# Patient Record
Sex: Male | Born: 1990 | Race: Black or African American | Hispanic: No | Marital: Single | Smoking: Never smoker
Health system: Southern US, Community
[De-identification: ages and names within clinical notes are randomized; demographics above are authoritative.]

---

## 2020-03-10 ENCOUNTER — Encounter (HOSPITAL_COMMUNITY): Payer: Self-pay | Admitting: *Deleted

## 2020-03-10 ENCOUNTER — Other Ambulatory Visit: Payer: Self-pay

## 2020-03-10 ENCOUNTER — Emergency Department (HOSPITAL_COMMUNITY)
Admission: EM | Admit: 2020-03-10 | Discharge: 2020-03-11 | Disposition: A | Discharge status: Home / Self Care | Payer: Self-pay | Attending: Emergency Medicine | Admitting: Emergency Medicine

## 2020-03-10 DIAGNOSIS — Z046 Encounter for general psychiatric examination, requested by authority: Secondary | ICD-10-CM | POA: Insufficient documentation

## 2020-03-10 DIAGNOSIS — F4323 Adjustment disorder with mixed anxiety and depressed mood: Secondary | ICD-10-CM | POA: Diagnosis present

## 2020-03-10 DIAGNOSIS — F4321 Adjustment disorder with depressed mood: Secondary | ICD-10-CM | POA: Insufficient documentation

## 2020-03-10 DIAGNOSIS — Z20822 Contact with and (suspected) exposure to covid-19: Secondary | ICD-10-CM | POA: Insufficient documentation

## 2020-03-10 LAB — ACETAMINOPHEN LEVEL: Acetaminophen (Tylenol), Serum: <10 ug/mL — ABNORMAL LOW (ref 10–30)

## 2020-03-10 LAB — CBC
HCT: 41.6 % (ref 39.0–52.0)
Hemoglobin: 14.3 g/dL (ref 13.0–17.0)
MCH: 28.4 pg (ref 26.0–34.0)
MCHC: 34.4 g/dL (ref 30.0–36.0)
MCV: 82.5 fL (ref 80.0–100.0)
Platelets: 321 10*3/uL (ref 150–400)
RBC: 5.04 MIL/uL (ref 4.22–5.81)
RDW: 12.9 % (ref 11.5–15.5)
WBC: 16.5 10*3/uL — ABNORMAL HIGH (ref 4.0–10.5)
nRBC: 0 % (ref 0.0–0.2)

## 2020-03-10 LAB — COMPREHENSIVE METABOLIC PANEL
ALT: 21 U/L (ref 0–44)
AST: 27 U/L (ref 15–41)
Albumin: 4.8 g/dL (ref 3.5–5.0)
Alkaline Phosphatase: 61 U/L (ref 38–126)
Anion gap: 11 (ref 5–15)
BUN: 15 mg/dL (ref 6–20)
CO2: 26 mmol/L (ref 22–32)
Calcium: 9.4 mg/dL (ref 8.9–10.3)
Chloride: 101 mmol/L (ref 98–111)
Creatinine, Ser: 0.86 mg/dL (ref 0.61–1.24)
GFR calc Af Amer: >60 mL/min (ref >60)
GFR calc non Af Amer: >60 mL/min (ref >60)
Glucose, Bld: 96 mg/dL (ref 70–99)
Potassium: 3.4 mmol/L — ABNORMAL LOW (ref 3.5–5.1)
Sodium: 138 mmol/L (ref 135–145)
Total Bilirubin: 0.7 mg/dL (ref 0.3–1.2)
Total Protein: 8.6 g/dL — ABNORMAL HIGH (ref 6.5–8.1)

## 2020-03-10 LAB — SALICYLATE LEVEL: Salicylate Lvl: <7 mg/dL — ABNORMAL LOW (ref 7.0–30.0)

## 2020-03-10 LAB — ETHANOL: Alcohol, Ethyl (B): <10 mg/dL (ref <10)

## 2020-03-10 NOTE — Progress Notes (Addendum)
TOC CM spoke to pt at bedside to gain information on emergency contacts and who he desires TOC CSW/CM to share information. States his mother and girlfriend can be placed as his emergency contacts but he does not want any medical information shared with them at this time. Explained his girlfriend did call but was explained due to HIPAA not information could be provided. States "I spoke to her and I told her I was not coming back". States he was going to stay with a friend/homeboy.   Isidoro Donning RN CCM, [facility] TOC CM [phone]

## 2020-03-10 NOTE — Progress Notes (Addendum)
@  2:45pm   TOC CSW spoke withPts gf/Brianna called to verify information that [employer] had given her about pt being at [facility].  CSW disclosed to pts gf/Brianna that due to HIPPA, CSW is not to violate pts right with denial or confirmation that pt was at [facility] ED.  CSW actively listened to Brianna's concern for her and her son's safety.  Colin Mulders wants to be contacted in regards to pts dc.   @3 :10pm  TOC CSW/CM spoke with pt and verified that pts emergency contacts.  Pt stated that mom/ Chauncey Sciulli, [phone] and gf/Brianna [phone], [phone] is emergency contacts.  Pt only wants emergency contacts contacted in the case of an emergency.    Hillery Bhalla Tarpley-Carter, MSW, LCSW-A Pronouns:  She, Her, Hers                  [phone] Long ED Transitions of CareClinical Social Worker [email or web address] [phone]
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
## 2020-03-10 NOTE — ED Notes (Signed)
Patient is refusing to dress out in clothes

## 2020-03-11 ENCOUNTER — Encounter (HOSPITAL_COMMUNITY): Payer: Self-pay | Admitting: Registered Nurse

## 2020-03-11 DIAGNOSIS — F4323 Adjustment disorder with mixed anxiety and depressed mood: Secondary | ICD-10-CM

## 2020-03-11 DIAGNOSIS — F4321 Adjustment disorder with depressed mood: Secondary | ICD-10-CM

## 2020-03-11 DIAGNOSIS — Z046 Encounter for general psychiatric examination, requested by authority: Secondary | ICD-10-CM

## 2020-03-11 LAB — SARS CORONAVIRUS 2 BY RT PCR (HOSPITAL ORDER, PERFORMED IN ~~LOC~~ HOSPITAL LAB): SARS Coronavirus 2: NEGATIVE

## 2020-03-11 NOTE — Discharge Instructions (Signed)
For your behavioral health needs, you are advised to follow up with [facility]:       [facility]      [address]      [place], [place] [address]      [phone]      New patients are being seen in their walk-in clinic.  Walk-in hours are Monday - Thursday from 8:00 am - 11:00 am for psychiatry, and Friday from 1:00 pm - 4:00 pm for therapy.  Walk-in patients are seen on a first come, first served basis, so try to arrive as early as possible for the best chance of being seen the same day.
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
## 2020-03-11 NOTE — ED Provider Notes (Signed)
Emergency Medicine Observation Re-evaluation Note  Matthew Tanner is a 29 y.o. male, seen on rounds today.  Pt initially presented to the ED for complaints of Medical Clearance and IVC Currently, the patient is sleeping.  Physical Exam  BP (!) 146/103 (BP Location: Right Arm)   Pulse 72   Temp 97.8 F (36.6 C) (Oral)   Resp 16   Ht 5\' 8"  (1.727 m)   Wt 117.9 kg   SpO2 100%   BMI 39.53 kg/m  Physical Exam CONSTITUTIONAL: Well-appearing, NAD NEURO: Sleeping with no focal deficits ENT/NECK:  supple, no JVD CARDIO: Regular rate, well-perfused PULM: No increased work of breathing GI/GU: Nondistended MSK/SPINE:  No gross deformities, no edema SKIN:  no rash, atraumatic PSYCH: Deferred  ED Course / MDM  EKG:    I have reviewed the labs performed to date as well as medications administered while in observation.  Recent changes in the last 24 hours include none.  Plan  Current plan is for reevaluation of need for IVC in the morning.  IVC currently set to expire this afternoon. Patient is under full IVC at this time.   , MD 03/11/20 [phone]

## 2021-03-15 ENCOUNTER — Emergency Department (HOSPITAL_COMMUNITY)
Admission: EM | Admit: 2021-03-15 | Discharge: 2021-03-15 | Disposition: A | Discharge status: Home / Self Care | Payer: Self-pay | Attending: Emergency Medicine | Admitting: Emergency Medicine

## 2021-03-15 ENCOUNTER — Emergency Department (HOSPITAL_COMMUNITY): Payer: Self-pay

## 2021-03-15 ENCOUNTER — Other Ambulatory Visit: Payer: Self-pay

## 2021-03-15 DIAGNOSIS — T1490XA Injury, unspecified, initial encounter: Secondary | ICD-10-CM

## 2021-03-15 DIAGNOSIS — R52 Pain, unspecified: Secondary | ICD-10-CM

## 2021-03-15 DIAGNOSIS — Z20822 Contact with and (suspected) exposure to covid-19: Secondary | ICD-10-CM | POA: Insufficient documentation

## 2021-03-15 DIAGNOSIS — M79601 Pain in right arm: Secondary | ICD-10-CM | POA: Insufficient documentation

## 2021-03-15 DIAGNOSIS — M25511 Pain in right shoulder: Secondary | ICD-10-CM | POA: Insufficient documentation

## 2021-03-15 DIAGNOSIS — Y900 Blood alcohol level of less than 20 mg/100 ml: Secondary | ICD-10-CM | POA: Insufficient documentation

## 2021-03-15 DIAGNOSIS — S4351XA Sprain of right acromioclavicular joint, initial encounter: Secondary | ICD-10-CM

## 2021-03-15 DIAGNOSIS — Y92411 Interstate highway as the place of occurrence of the external cause: Secondary | ICD-10-CM | POA: Insufficient documentation

## 2021-03-15 DIAGNOSIS — S43401A Unspecified sprain of right shoulder joint, initial encounter: Secondary | ICD-10-CM | POA: Insufficient documentation

## 2021-03-15 LAB — I-STAT CHEM 8, ED
BUN: 13 mg/dL (ref 6–20)
Calcium, Ion: 1.19 mmol/L (ref 1.15–1.40)
Chloride: 101 mmol/L (ref 98–111)
Creatinine, Ser: 0.9 mg/dL (ref 0.61–1.24)
Glucose, Bld: 120 mg/dL — ABNORMAL HIGH (ref 70–99)
HCT: 40 % (ref 39.0–52.0)
Hemoglobin: 13.6 g/dL (ref 13.0–17.0)
Potassium: 3.6 mmol/L (ref 3.5–5.1)
Sodium: 139 mmol/L (ref 135–145)
TCO2: 26 mmol/L (ref 22–32)

## 2021-03-15 LAB — SAMPLE TO BLOOD BANK

## 2021-03-15 LAB — RESP PANEL BY RT-PCR (FLU A&B, COVID) ARPGX2
Influenza A by PCR: NEGATIVE
Influenza B by PCR: NEGATIVE
SARS Coronavirus 2 by RT PCR: NEGATIVE

## 2021-03-15 LAB — COMPREHENSIVE METABOLIC PANEL
ALT: 20 U/L (ref 0–44)
AST: 19 U/L (ref 15–41)
Albumin: 4.1 g/dL (ref 3.5–5.0)
Alkaline Phosphatase: 57 U/L (ref 38–126)
Anion gap: 10 (ref 5–15)
BUN: 12 mg/dL (ref 6–20)
CO2: 25 mmol/L (ref 22–32)
Calcium: 9.6 mg/dL (ref 8.9–10.3)
Chloride: 101 mmol/L (ref 98–111)
Creatinine, Ser: 0.92 mg/dL (ref 0.61–1.24)
GFR, Estimated: >60 mL/min (ref >60)
Glucose, Bld: 125 mg/dL — ABNORMAL HIGH (ref 70–99)
Potassium: 3.5 mmol/L (ref 3.5–5.1)
Sodium: 136 mmol/L (ref 135–145)
Total Bilirubin: 0.7 mg/dL (ref 0.3–1.2)
Total Protein: 7.8 g/dL (ref 6.5–8.1)

## 2021-03-15 LAB — CBC
HCT: 40.8 % (ref 39.0–52.0)
Hemoglobin: 14 g/dL (ref 13.0–17.0)
MCH: 28.6 pg (ref 26.0–34.0)
MCHC: 34.3 g/dL (ref 30.0–36.0)
MCV: 83.4 fL (ref 80.0–100.0)
Platelets: 303 10*3/uL (ref 150–400)
RBC: 4.89 MIL/uL (ref 4.22–5.81)
RDW: 12.5 % (ref 11.5–15.5)
WBC: 20.3 10*3/uL — ABNORMAL HIGH (ref 4.0–10.5)
nRBC: 0 % (ref 0.0–0.2)

## 2021-03-15 LAB — PROTIME-INR
INR: 1.1 (ref 0.8–1.2)
Prothrombin Time: 13.7 seconds (ref 11.4–15.2)

## 2021-03-15 LAB — ETHANOL: Alcohol, Ethyl (B): <10 mg/dL (ref <10)

## 2021-03-15 LAB — LACTIC ACID, PLASMA: Lactic Acid, Venous: 1.4 mmol/L (ref 0.5–1.9)

## 2021-03-15 MED ORDER — ACETAMINOPHEN 325 MG PO TABS: 650.0000 mg | ORAL_TABLET | Freq: Once | ORAL | Status: DC

## 2021-03-15 MED ORDER — IBUPROFEN 800 MG PO TABS: 800.0000 mg | ORAL_TABLET | Freq: Three times a day (TID) | ORAL | 0 refills | Status: DC | PRN
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
## 2021-03-15 NOTE — Progress Notes (Signed)
   03/15/21 2103  Clinical Encounter Type  Visited With Patient  Visit Type Trauma  Referral From Nurse  Consult/Referral To Chaplain   Chaplain responded. Patient sitting up in the bed and open for spiritual care visit. There was no support person present. Offered spiritual care of pray. This note was prepared by Deneen Harts, M.Div..  For questions please contact by phone [phone].
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
## 2021-03-16 ENCOUNTER — Encounter (HOSPITAL_COMMUNITY): Payer: Self-pay | Admitting: Emergency Medicine

## 2021-04-05 ENCOUNTER — Emergency Department (HOSPITAL_COMMUNITY)
Admission: EM | Admit: 2021-04-05 | Discharge: 2021-04-05 | Disposition: A | Discharge status: Home / Self Care | Payer: Self-pay | Attending: Emergency Medicine | Admitting: Emergency Medicine

## 2021-04-05 ENCOUNTER — Other Ambulatory Visit: Payer: Self-pay

## 2021-04-05 ENCOUNTER — Encounter (HOSPITAL_COMMUNITY): Payer: Self-pay | Admitting: *Deleted

## 2021-04-05 DIAGNOSIS — R519 Headache, unspecified: Secondary | ICD-10-CM | POA: Insufficient documentation

## 2021-04-05 DIAGNOSIS — Z5321 Procedure and treatment not carried out due to patient leaving prior to being seen by health care provider: Secondary | ICD-10-CM | POA: Insufficient documentation
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
## 2021-04-05 NOTE — ED Notes (Signed)
Pt has not checked in for vitals

## 2021-04-05 NOTE — ED Notes (Signed)
Called pt x3 for vitals, no response. 

## 2021-06-01 ENCOUNTER — Emergency Department (HOSPITAL_COMMUNITY)
Admission: EM | Admit: 2021-06-01 | Discharge: 2021-06-02 | Discharge status: Against Medical Advice | Payer: Self-pay | Attending: Emergency Medicine | Admitting: Emergency Medicine

## 2021-06-01 ENCOUNTER — Encounter (HOSPITAL_COMMUNITY): Payer: Self-pay | Admitting: Emergency Medicine

## 2021-06-01 ENCOUNTER — Other Ambulatory Visit: Payer: Self-pay

## 2021-06-01 DIAGNOSIS — Z5321 Procedure and treatment not carried out due to patient leaving prior to being seen by health care provider: Secondary | ICD-10-CM | POA: Insufficient documentation

## 2021-06-01 DIAGNOSIS — K0889 Other specified disorders of teeth and supporting structures: Secondary | ICD-10-CM | POA: Insufficient documentation

## 2021-06-01 NOTE — ED Triage Notes (Signed)
Patient reports left upper dental pain with swelling onset 2 days ago .

## 2021-06-01 NOTE — ED Provider Notes (Signed)
Emergency Medicine Provider Triage Evaluation Note  Matthew Tanner , a 30 y.o. male  was evaluated in triage.  Pt complains of left upper dental pain with facial swelling.  This started 2 days ago.  He poked the area with a needle and then the swelling worsened.  He denies any fevers.  He has pain whenever he eats or drinks however has no difficulty swallowing.  No sore throat.  Review of Systems  Positive: See above Negative:   Physical Exam  BP (!) 143/87 (BP Location: Left Arm)   Pulse 77   Temp 99 F (37.2 C) (Oral)   Resp 18   SpO2 100%  Gen:   Awake, no distress   Resp:  Normal effort  MSK:   Moves extremities without difficulty  Other:  There is a large, approximately 1.5 to 2 cm interaural area of edema between the left upper molars and the gingival surface.  This area is fluctuant and tender without active drainage and appears to be a drainable abscess at this time.  Medical Decision Making  Medically screening exam initiated at 10:44 PM.  Appropriate orders placed.  Matthew Tanner was informed that the remainder of the evaluation will be completed by another provider, this initial triage assessment does not replace that evaluation, and the importance of remaining in the ED until their evaluation is complete.  Patient presents today for evaluation of left upper dental pain.  This appears to be complicated by a dental abscess that appears large enough to need drainage.  He is afebrile and not meeting sepsis criteria at this time. Patient is not suitable for discharge from triage due to suspecting he needs a dental abscess drained.  Note: Portions of this report may have been transcribed using voice recognition software. Every effort was made to ensure accuracy; however, inadvertent computerized transcription errors may be present    Cristina Gong, PA-C 06/02/21 0000    Gloris Manchester, MD 06/02/21 [phone]

## 2021-06-02 ENCOUNTER — Emergency Department (HOSPITAL_BASED_OUTPATIENT_CLINIC_OR_DEPARTMENT_OTHER)
Admission: EM | Admit: 2021-06-02 | Discharge: 2021-06-03 | Disposition: A | Discharge status: Home / Self Care | Payer: Self-pay | Attending: Emergency Medicine | Admitting: Emergency Medicine

## 2021-06-02 ENCOUNTER — Emergency Department (HOSPITAL_BASED_OUTPATIENT_CLINIC_OR_DEPARTMENT_OTHER): Payer: Self-pay

## 2021-06-02 ENCOUNTER — Encounter (HOSPITAL_BASED_OUTPATIENT_CLINIC_OR_DEPARTMENT_OTHER): Payer: Self-pay

## 2021-06-02 ENCOUNTER — Other Ambulatory Visit: Payer: Self-pay

## 2021-06-02 DIAGNOSIS — R220 Localized swelling, mass and lump, head: Secondary | ICD-10-CM | POA: Insufficient documentation

## 2021-06-02 LAB — CBC WITH DIFFERENTIAL/PLATELET
Abs Immature Granulocytes: 0.04 10*3/uL (ref 0.00–0.07)
Basophils Absolute: 0.1 10*3/uL (ref 0.0–0.1)
Basophils Relative: 0 %
Eosinophils Absolute: 0.1 10*3/uL (ref 0.0–0.5)
Eosinophils Relative: 1 %
HCT: 39.8 % (ref 39.0–52.0)
Hemoglobin: 13.6 g/dL (ref 13.0–17.0)
Immature Granulocytes: 0 %
Lymphocytes Relative: 26 %
Lymphs Abs: 3 10*3/uL (ref 0.7–4.0)
MCH: 28.1 pg (ref 26.0–34.0)
MCHC: 34.2 g/dL (ref 30.0–36.0)
MCV: 82.2 fL (ref 80.0–100.0)
Monocytes Absolute: 1 10*3/uL (ref 0.1–1.0)
Monocytes Relative: 8 %
Neutro Abs: 7.5 10*3/uL (ref 1.7–7.7)
Neutrophils Relative %: 65 %
Platelets: 359 10*3/uL (ref 150–400)
RBC: 4.84 MIL/uL (ref 4.22–5.81)
RDW: 12.9 % (ref 11.5–15.5)
WBC: 11.7 10*3/uL — ABNORMAL HIGH (ref 4.0–10.5)
nRBC: 0 % (ref 0.0–0.2)

## 2021-06-02 LAB — BASIC METABOLIC PANEL
Anion gap: 8 (ref 5–15)
BUN: 12 mg/dL (ref 6–20)
CO2: 29 mmol/L (ref 22–32)
Calcium: 9.6 mg/dL (ref 8.9–10.3)
Chloride: 104 mmol/L (ref 98–111)
Creatinine, Ser: 0.78 mg/dL (ref 0.61–1.24)
GFR, Estimated: >60 mL/min (ref >60)
Glucose, Bld: 86 mg/dL (ref 70–99)
Potassium: 3.6 mmol/L (ref 3.5–5.1)
Sodium: 141 mmol/L (ref 135–145)

## 2021-06-02 MED ORDER — AMOXICILLIN-POT CLAVULANATE 875-125 MG PO TABS
1.0000 | ORAL_TABLET | Freq: Once | ORAL | Status: AC
  Administered 2021-06-02: 1 via ORAL
  Filled 2021-06-02: qty 1

## 2021-06-02 MED ORDER — KETOROLAC TROMETHAMINE 15 MG/ML IJ SOLN
15.0000 mg | Freq: Once | INTRAMUSCULAR | Status: AC
  Administered 2021-06-02: 15 mg via INTRAVENOUS
  Filled 2021-06-02: qty 1

## 2021-06-02 MED ORDER — AMOXICILLIN-POT CLAVULANATE 875-125 MG PO TABS: 1.0000 | ORAL_TABLET | Freq: Two times a day (BID) | ORAL | 0 refills | Status: AC

## 2021-06-02 MED ORDER — HYDROCODONE-ACETAMINOPHEN 5-325 MG PO TABS
1.0000 | ORAL_TABLET | Freq: Once | ORAL | Status: AC
  Administered 2021-06-02: 1 via ORAL
  Filled 2021-06-02: qty 1

## 2021-06-02 MED ORDER — IOHEXOL 300 MG/ML  SOLN
75.0000 mL | Freq: Once | INTRAMUSCULAR | Status: AC | PRN
  Administered 2021-06-02: 75 mL via INTRAVENOUS

## 2021-06-02 NOTE — ED Notes (Signed)
Pt did not respond when called for registration

## 2021-06-02 NOTE — ED Provider Notes (Signed)
[facility] EMERGENCY DEPT Provider Note   CSN: [id number] Arrival date & time: 06/02/21  1801     History Chief Complaint  Patient presents with   Facial Swelling    Left    Matthew Tanner is a 30 y.o. male.   Dental Pain Location:  Upper Upper teeth location:  14/LU 1st molar Quality:  Aching Severity:  Moderate Onset quality:  Gradual Duration:  2 days Timing:  Constant Progression:  Worsening Chronicity:  New Associated symptoms: facial pain and facial swelling   Associated symptoms: no congestion, no difficulty swallowing, no drooling, no fever, no headaches, no neck pain, no neck swelling and no trismus     History reviewed. No pertinent past medical history.  Patient Active Problem List   Diagnosis Date Noted   Adjustment disorder with mixed anxiety and depressed mood 03/11/2020   Grief    Involuntary commitment     History reviewed. No pertinent surgical history.     No family history on file.  Social History   Tobacco Use   Smoking status: Never   Smokeless tobacco: Never  Vaping Use   Vaping Use: Never used  Substance Use Topics   Alcohol use: Never   Drug use: Never    Home Medications Prior to Admission medications   Medication Sig Start Date End Date Taking? Authorizing Provider  amoxicillin-clavulanate (AUGMENTIN) 875-125 MG tablet Take 1 tablet by mouth every 12 (twelve) hours. 06/02/21  Yes Gloris Manchester, MD  ibuprofen (ADVIL) 800 MG tablet Take 1 tablet (800 mg total) by mouth every 8 (eight) hours as needed. 03/15/21   Terrilee Files, MD    Allergies    Patient has no known allergies.  Review of Systems   Review of Systems  Constitutional:  Negative for appetite change, chills, fatigue and fever.  HENT:  Positive for dental problem and facial swelling. Negative for congestion, drooling, ear discharge, ear pain, rhinorrhea, sinus pressure, sore throat, trouble swallowing and voice change.   Eyes:  Negative for  photophobia, pain, redness and visual disturbance.  Respiratory:  Negative for cough, chest tightness and shortness of breath.   Cardiovascular:  Negative for chest pain and palpitations.  Gastrointestinal:  Negative for abdominal pain, diarrhea, nausea and vomiting.  Genitourinary:  Negative for dysuria and hematuria.  Musculoskeletal:  Negative for arthralgias, back pain and neck pain.  Skin:  Negative for color change and rash.  Allergic/Immunologic: Negative for immunocompromised state.  Neurological:  Negative for dizziness, seizures, syncope, speech difficulty, weakness, light-headedness, numbness and headaches.  Hematological:  Does not bruise/bleed easily.  All other systems reviewed and are negative.  Physical Exam Updated Vital Signs BP (!) 137/91   Pulse 64   Temp 98.6 F (37 C) (Oral)   Resp 19   Ht 5\' 8"  (1.727 m)   Wt 99.8 kg   SpO2 100%   BMI 33.45 kg/m   Physical Exam Vitals and nursing note reviewed.  Constitutional:      General: He is not in acute distress.    Appearance: Normal appearance. He is well-developed. He is not ill-appearing, toxic-appearing or diaphoretic.  HENT:     Head: Normocephalic and atraumatic.     Right Ear: External ear normal.     Left Ear: External ear normal.     Nose: Nose normal. No congestion.     Mouth/Throat:     Mouth: Mucous membranes are moist.     Pharynx: No oropharyngeal exudate or posterior oropharyngeal erythema.  Comments: Mild swelling to left buccal mucosa.  Mild gingival swelling in the area of upper left molar.  No areas of fluctuance or induration appreciated on exam. Eyes:     General: No scleral icterus.    Extraocular Movements: Extraocular movements intact.     Conjunctiva/sclera: Conjunctivae normal.  Cardiovascular:     Rate and Rhythm: Normal rate and regular rhythm.     Heart sounds: No murmur heard. Pulmonary:     Effort: Pulmonary effort is normal. No respiratory distress.  Abdominal:      Palpations: Abdomen is soft.  Musculoskeletal:        General: No swelling. Normal range of motion.     Cervical back: Normal range of motion and neck supple. No rigidity.  Skin:    General: Skin is warm and dry.     Capillary Refill: Capillary refill takes less than 2 seconds.     Coloration: Skin is not jaundiced or pale.  Neurological:     General: No focal deficit present.     Mental Status: He is alert and oriented to person, place, and time.     Cranial Nerves: No cranial nerve deficit.     Sensory: No sensory deficit.     Motor: No weakness.  Psychiatric:        Mood and Affect: Mood normal.        Behavior: Behavior normal.        Thought Content: Thought content normal.        Judgment: Judgment normal.    ED Results / Procedures / Treatments   Labs (all labs ordered are listed, but only abnormal results are displayed) Labs Reviewed  CBC WITH DIFFERENTIAL/PLATELET - Abnormal; Notable for the following components:      Result Value   WBC 11.7 (*)    All other components within normal limits  BASIC METABOLIC PANEL    EKG None  Radiology CT Maxillofacial W Contrast  Result Date: 06/02/2021 CLINICAL DATA:  Left facial swelling and dental pain for 2 days EXAM: CT MAXILLOFACIAL WITH CONTRAST TECHNIQUE: Multidetector CT imaging of the maxillofacial structures was performed with intravenous contrast. Multiplanar CT image reconstructions were also generated. CONTRAST:  64mL OMNIPAQUE IOHEXOL 300 MG/ML  SOLN COMPARISON:  None. FINDINGS: Osseous: No acute fracture or mandibular dislocation. No destructive process. No evidence of left mandibular or maxillary erosion. Periapical lucency about the roots of the right first mandibular molar (series 4, image 28). No other significant periapical lucency. Orbits: Negative. No traumatic or inflammatory finding. Sinuses: Minimal mucosal thickening in the inferior maxillary sinuses. Soft tissues: Stranding in the soft tissues of the left  face, overlying the mandible and, to a lesser extent, the left maxilla. No definite focal collection is seen, although evaluation is somewhat limited by beam hardening artifact from dental hardware in the posterior left mandibular teeth. Prominent bilateral level left greater than right 1A and 1B lymph nodes, which retain normal morphology, likely reactive. Limited intracranial: No significant or unexpected finding. IMPRESSION: 1. No definite focal collection is seen in the soft tissues adjacent to the left maxilla or mandible, although evaluation is somewhat limited by beam hardening artifact from dental hardware. Edema in the overlying soft tissues. 2. Periapical lucency about the roots of the right first mandibular molar, concerning for dental caries. 3. Prominent left greater than right level 1A and 1B lymph nodes, which retain normal morphology, likely reactive. Electronically Signed   By: Merilyn Baba M.D.   On: 06/02/2021 23:54  Procedures Procedures   Medications Ordered in ED Medications  ketorolac (TORADOL) 15 MG/ML injection 15 mg (15 mg Intravenous Given 06/02/21 2139)  HYDROcodone-acetaminophen (NORCO/VICODIN) 5-325 MG per tablet 1 tablet (1 tablet Oral Given 06/02/21 2138)  amoxicillin-clavulanate (AUGMENTIN) 875-125 MG per tablet 1 tablet (1 tablet Oral Given 06/02/21 2138)  iohexol (OMNIPAQUE) 300 MG/ML solution 75 mL (75 mLs Intravenous Contrast Given 06/02/21 2242)    ED Course  I have reviewed the triage vital signs and the nursing notes.  Pertinent labs & imaging results that were available during my care of the patient were reviewed by me and considered in my medical decision making (see chart for details).    MDM Rules/Calculators/A&P                          Patient presents for left upper molar pain and facial swelling.  He denies any systemic symptoms.  Vital signs on arrival are normal.  Patient is well-appearing on exam.  He does have mild swelling to the area of  his left cheek and face.  He has no trismus on exam.  There are no areas of fluctuance or induration.  Patient was provided analgesia.  CT scan of face showed no drainable fluid collections.  Patient was prescribed antibiotics and was provided with a list of dentists to contact.  He was discharged in good condition.  Final Clinical Impression(s) / ED Diagnoses Final diagnoses:  Left facial swelling    Rx / DC Orders ED Discharge Orders          Ordered    amoxicillin-clavulanate (AUGMENTIN) 875-125 MG tablet  Every 12 hours        06/02/21 2359             Godfrey Pick, MD 06/04/21 [phone]

## 2021-06-02 NOTE — ED Notes (Signed)
Pt called for vitals with no response, not seen in or outside lobby

## 2021-06-02 NOTE — ED Notes (Signed)
Patient transported to CT 

## 2021-06-02 NOTE — ED Triage Notes (Signed)
Pt arrives POV with left facial swelling and dental pain x2 days.

## 2021-06-03 NOTE — Discharge Instructions (Signed)
Schedule an appointment with dentist to soon as possible.  If you do not have a dentist, there are a list of contacts below.  There is a prescription for antibiotics sent to [facility] on [address]  Take as prescribed.  Take ibuprofen and Tylenol for control of pain.  Return to the ED for any worsening symptoms.

## 2021-08-24 ENCOUNTER — Emergency Department (HOSPITAL_BASED_OUTPATIENT_CLINIC_OR_DEPARTMENT_OTHER)
Admission: EM | Admit: 2021-08-24 | Discharge: 2021-08-24 | Disposition: A | Discharge status: Home / Self Care | Payer: Self-pay | Attending: Emergency Medicine | Admitting: Emergency Medicine

## 2021-08-24 ENCOUNTER — Emergency Department (HOSPITAL_BASED_OUTPATIENT_CLINIC_OR_DEPARTMENT_OTHER): Payer: Self-pay

## 2021-08-24 ENCOUNTER — Other Ambulatory Visit: Payer: Self-pay

## 2021-08-24 ENCOUNTER — Encounter (HOSPITAL_BASED_OUTPATIENT_CLINIC_OR_DEPARTMENT_OTHER): Payer: Self-pay | Admitting: Emergency Medicine

## 2021-08-24 DIAGNOSIS — M79671 Pain in right foot: Secondary | ICD-10-CM | POA: Insufficient documentation

## 2021-08-24 MED ORDER — KETOROLAC TROMETHAMINE 30 MG/ML IJ SOLN
30.0000 mg | Freq: Once | INTRAMUSCULAR | Status: AC
  Administered 2021-08-24: 30 mg via INTRAMUSCULAR
  Filled 2021-08-24: qty 1

## 2021-08-24 MED ORDER — IBUPROFEN 800 MG PO TABS: 800.0000 mg | ORAL_TABLET | Freq: Three times a day (TID) | ORAL | 0 refills | Status: AC | PRN

## 2021-08-24 NOTE — Discharge Instructions (Addendum)
You were seen today for foot pain.  This is likely inflammatory in nature.  Take medication as prescribed scheduled for the next 2 to 3 days.  Ice and elevate.  Follow-up with sports medicine if not improving. ?

## 2021-08-24 NOTE — ED Provider Notes (Signed)
?[facility] EM[facility]PT ?Provider Note ? ? ?CSN: [id number] ?Arrival date & time: 08/24/21  2026 ? ?  ? ?History ? ?Chief Complaint  ?Patient presents with  ? Foot Pain  ? ? ?Phill Million is a 31 y.o. male. ? ?HPI ? ?  ? ?This is a 31 year old male with no reported past medical history who presents with right foot pain.  Patient reports onset of foot pain yesterday.  He states that the pain is over the top of his foot and on the bottom of his foot.  Denies injury.  Pain is worse with walking.  States he took naproxen and Motrin with minimal relief.  No fevers.  No history of inflammatory arthritis or gout.  No recent alcohol use or work intake.  She denies injury or change in footwear. ? ?Home Medications ?Prior to Admission medications   ?Medication Sig Start Date End Date Taking? Authorizing Provider  ?amoxicillin-clavulanate (AUGMENTIN) 875-125 MG tablet Take 1 tablet by mouth every 12 (twelve) hours. 06/02/21   Gloris Manchester, MD  ?ibuprofen (ADVIL) 800 MG tablet Take 1 tablet (800 mg total) by mouth every 8 (eight) hours as needed. 08/24/21   Josias Tomerlin, Mayer Masker, MD  ?   ? ?Allergies    ?Patient has no known allergies.   ? ?Review of Systems   ?Review of Systems  ?Musculoskeletal:   ?     Foot pain  ?All other systems reviewed and are negative. ? ?Physical Exam ?Updated Vital Signs ?BP 140/90 (BP Location: Left Leg)   Pulse (!) 102   Temp 98.4 ?F (36.9 ?C)   Resp 16   Ht 1.727 m (5\' 8" )   Wt 99.8 kg   SpO2 94%   BMI 33.45 kg/m?  ?Physical Exam ?Vitals and nursing note reviewed.  ?Constitutional:   ?   Appearance: He is well-developed. He is obese. He is not ill-appearing.  ?HENT:  ?   Head: Normocephalic and atraumatic.  ?   Mouth/Throat:  ?   Mouth: Mucous membranes are moist.  ?Cardiovascular:  ?   Rate and Rhythm: Normal rate and regular rhythm.  ?Pulmonary:  ?   Effort: Pulmonary effort is normal. No respiratory distress.  ?Musculoskeletal:  ?   Comments: Trace bilateral lower extremity  swelling, no obvious deformities, no asymmetric swelling, tenderness to palpation over the proximal aspect of the dorsum of the foot, no overlying skin changes, there is also tenderness palpation along the plantar fascia on the right, normal range of motion of the ankle, 2+ DP pulse  ?Skin: ?   General: Skin is warm and dry.  ?Neurological:  ?   Mental Status: He is alert and oriented to person, place, and time.  ?Psychiatric:     ?   Mood and Affect: Mood normal.  ? ? ?ED Results / Procedures / Treatments   ?Labs ?(all labs ordered are listed, but only abnormal results are displayed) ?Labs Reviewed - No data to display ? ?EKG ?None ? ?Radiology ?DG Foot Complete Right ? ?Result Date: 08/24/2021 ?CLINICAL DATA:  Which right foot pain while walking, no known injury, initial encounter EXAM: RIGHT FOOT COMPLETE - 3+ VIEW COMPARISON:  None. FINDINGS: No acute fracture or dislocation is noted. Soft tissue swelling is noted medially. IMPRESSION: Mild soft tissue swelling medially without acute bony abnormality. Electronically Signed   By: 10/24/2021 M.D.   On: 08/24/2021 22:43   ? ?Procedures ?Procedures  ? ? ?Medications Ordered in ED ?Medications  ?ketorolac (TORADOL) 30  MG/ML injection 30 mg (has no administration in time range)  ? ? ?ED Course/ Medical Decision Making/ A&P ?  ?                        ?Medical Decision Making ?Amount and/or Complexity of Data Reviewed ?Radiology: ordered. ? ?Risk ?Prescription drug management. ? ? ?This patient presents to the ED for concern of foot pain, this involves an extensive number of treatment options, and is a complaint that carries with it a high risk of complications and morbidity.  The differential diagnosis includes injury, contusion, fracture, inflammatory arthritis such as gout, less likely septic arthritis ? ?MDM:   ? ?This is a 31 year old male who presents with atraumatic foot pain.  Nontoxic and vital signs are largely reassuring.  Physical exam is benign with the  exception of point tenderness over the dorsum of the foot and along the plantar fascia.  No significant swelling or overlying skin changes.  Doubt septic arthritis.  X-rays negative for fracture.  Could be contusion.  Regardless, we will treat the same with anti-inflammatories ?(Labs, imaging) ? ?Labs: ?I Ordered, and personally interpreted labs.  The pertinent results include: None ? ?Imaging Studies ordered: ?I ordered imaging studies including foot x-ray ?I independently visualized and interpreted imaging. ?I agree with the radiologist interpretation ? ?Additional history obtained from N/A.  External records from outside source obtained and reviewed including N/A ? ?Critical Interventions: ?IM Toradol ? ?Consultations: ?I requested consultation with the none,  and discussed lab and imaging findings as well as pertinent plan - they recommend: None ? ?Cardiac Monitoring: ?The patient was maintained on a cardiac monitor.  I personally viewed and interpreted the cardiac monitored which showed an underlying rhythm of: N/A ? ?Reevaluation: ?After the interventions noted above, I reevaluated the patient and found that they have :stayed the same ? ? ?Considered admission for: N/A ? ?Social Determinants of Health: ?Patient lives independently ? ?Disposition: Discharge ? ?Co morbidities that complicate the patient evaluation ?History reviewed. No pertinent past medical history.  ? ?Medicines ?Meds ordered this encounter  ?Medications  ? ketorolac (TORADOL) 30 MG/ML injection 30 mg  ? ibuprofen (ADVIL) 800 MG tablet  ?  Sig: Take 1 tablet (800 mg total) by mouth every 8 (eight) hours as needed.  ?  Dispense:  21 tablet  ?  Refill:  0  ?  ?I have reviewed the patients home medicines and have made adjustments as needed ? ?Problem List / ED Course: ?Problem List Items Addressed This Visit   ?None ?Visit Diagnoses   ? ? Foot pain, right    -  Primary  ? ?  ?  ? ? ? ?  ? ? ? ? ? ? ? ?Final Clinical Impression(s) / ED  Diagnoses ?Final diagnoses:  ?Foot pain, right  ? ? ?Rx / DC Orders ?ED Discharge Orders   ? ?      Ordered  ?  ibuprofen (ADVIL) 800 MG tablet  Every 8 hours PRN       ? 08/24/21 2330  ? ?  ?  ? ?  ? ? ?  ?Shon Baton, MD ?08/24/21 2334 ? ?

## 2021-08-24 NOTE — ED Triage Notes (Signed)
Pt c/o right foot pain upon waking, denies injury, no relief with naproxen and motrin  ?

## 2023-08-29 IMAGING — DX DG FOOT COMPLETE 3+V*R*
3 series · 3 of 3 positions shown · non-contrast
Comparison: None.

CLINICAL DATA: Which right foot pain while walking, no known
injury, initial encounter

EXAM:
RIGHT FOOT COMPLETE - 3+ VIEW

[foot ap]
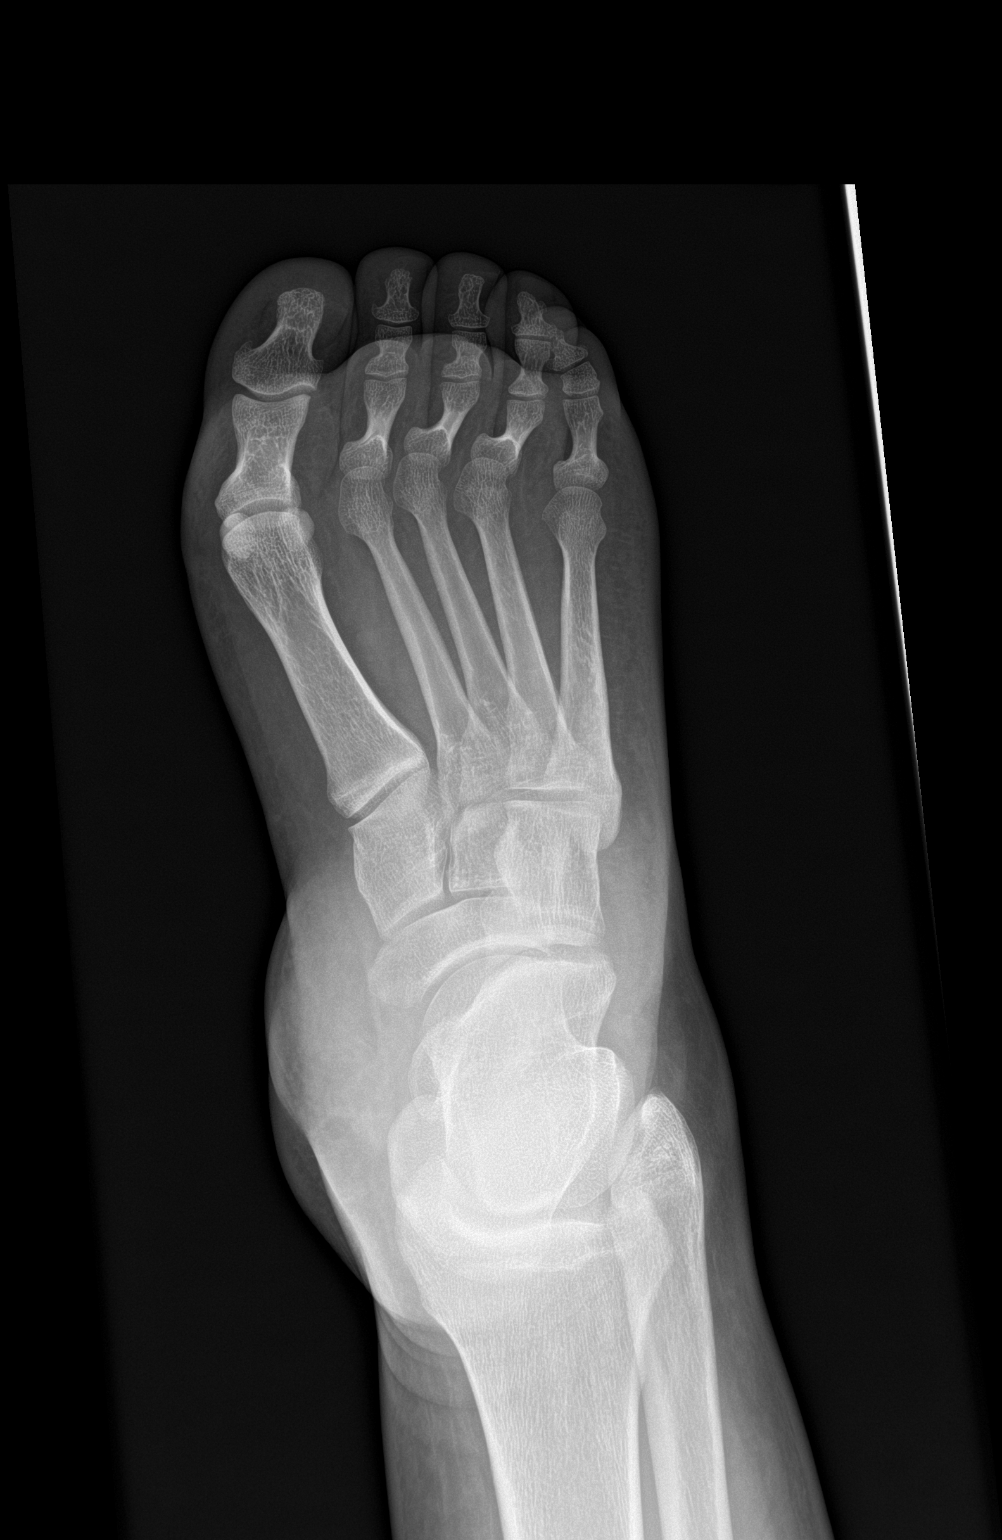

[foot obl]
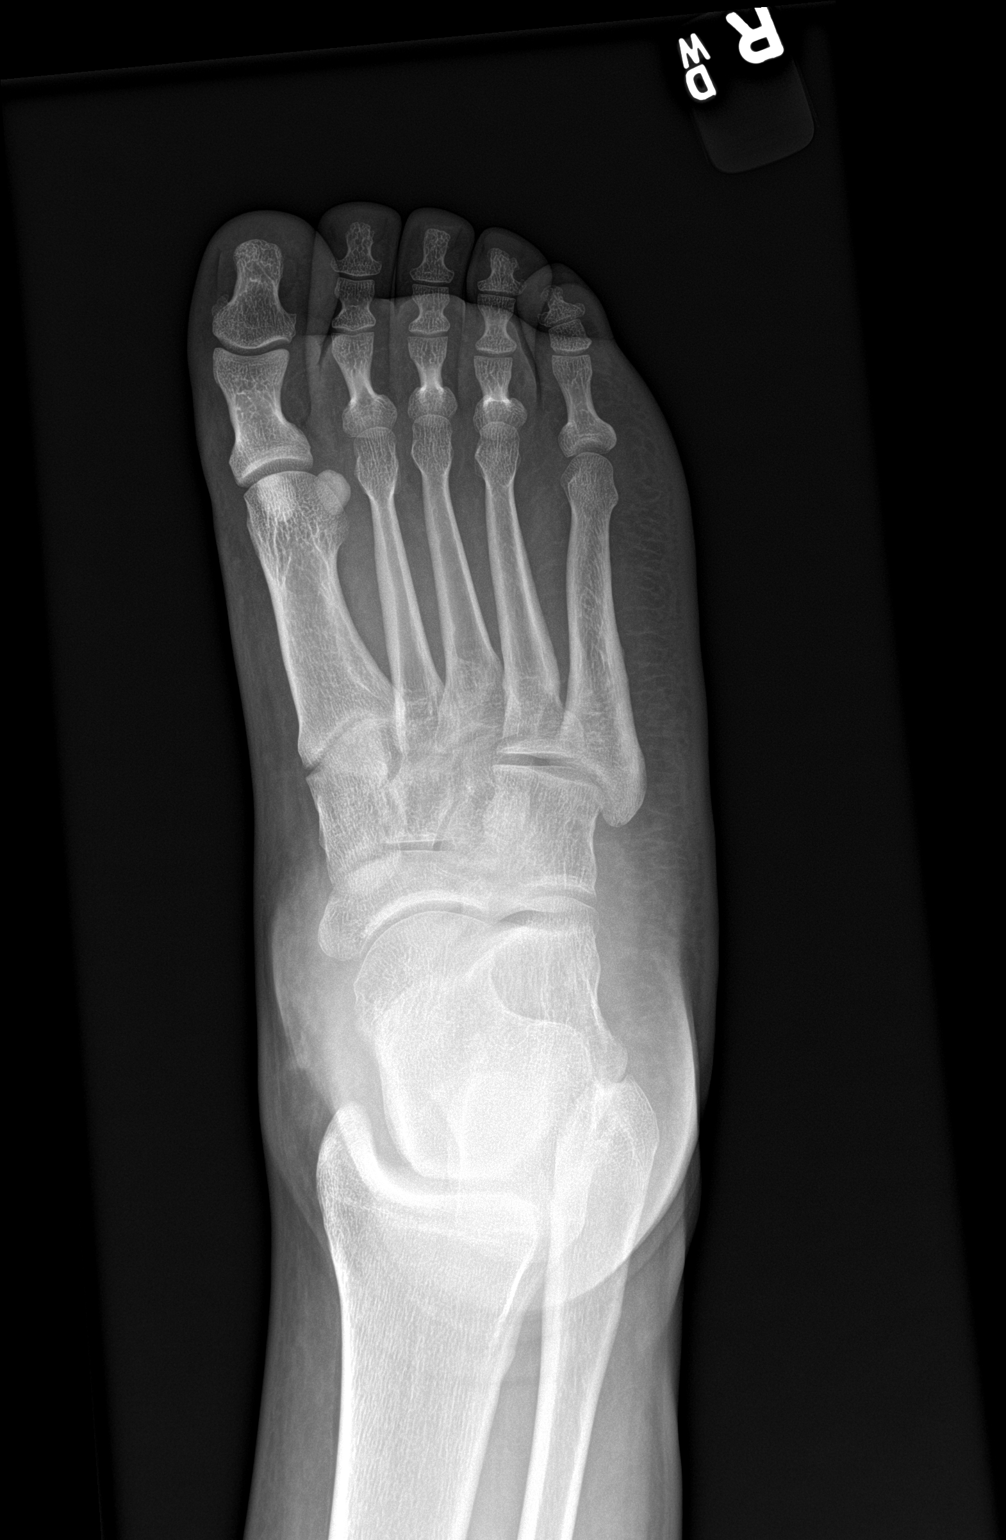

[foot lat]
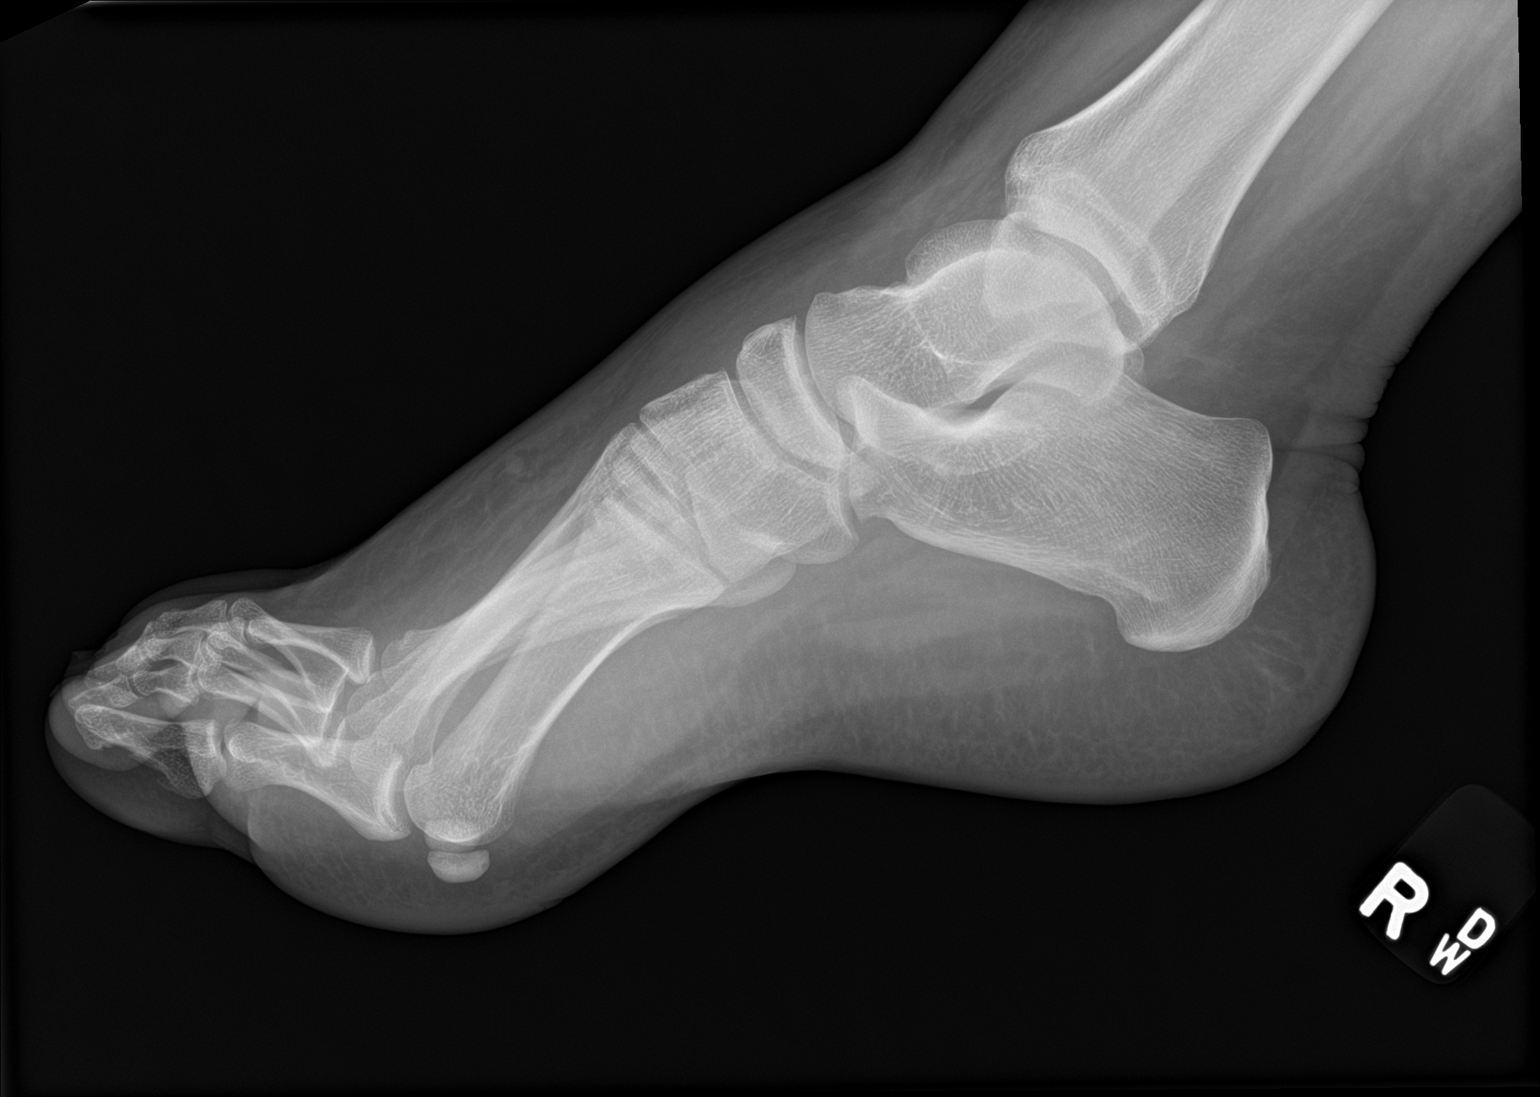

[3 of 3 positions shown; findings below may reference images not displayed]

FINDINGS: No acute fracture or dislocation is noted. Soft tissue swelling is
noted medially.
IMPRESSION: Mild soft tissue swelling medially without acute bony abnormality.
